# Patient Record
Sex: Female | Born: 1985 | Race: White | Hispanic: No | Marital: Married | Smoking: Never smoker
Health system: Southern US, Community
[De-identification: ages and names within clinical notes are randomized; demographics above are authoritative.]

## PROBLEM LIST (undated history)

## (undated) DIAGNOSIS — G43909 Migraine, unspecified, not intractable, without status migrainosus: Secondary | ICD-10-CM

---

## 2014-03-17 IMAGING — US US ABDOMEN COMPLETE
1 series · 14 of 25 positions shown · non-contrast
Comparison: C t enterography report, 01/10/2011

CLINICAL DATA: Abdominal pain and nausea for several months

COMPLETE ABDOMINAL ULTRASOUND

[Series 1: us abdomen complete · 0.23mm/px · 14 of 67 slices shown]
[im 1/67]
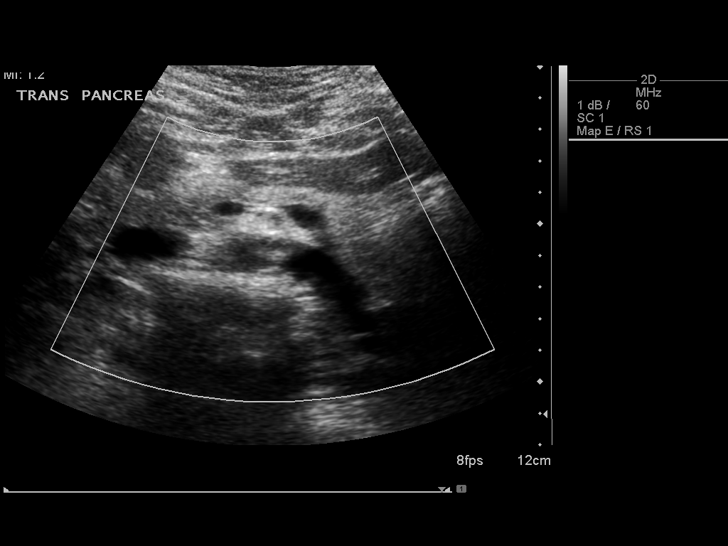
[im 6/67]
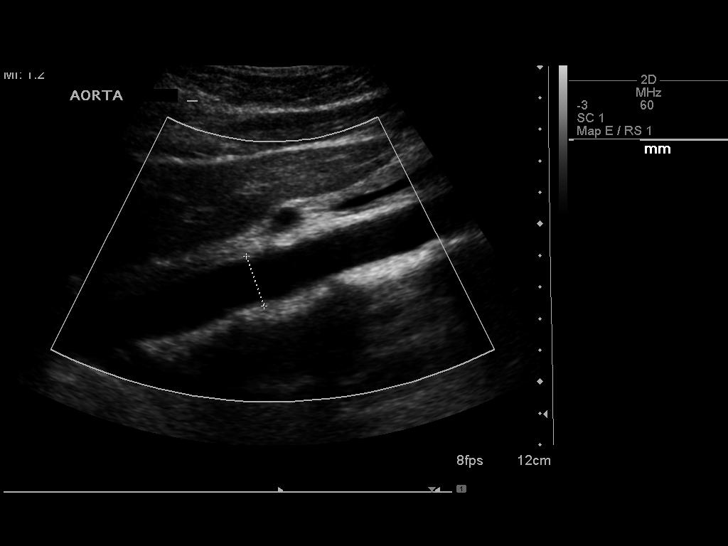
[im 12/67]
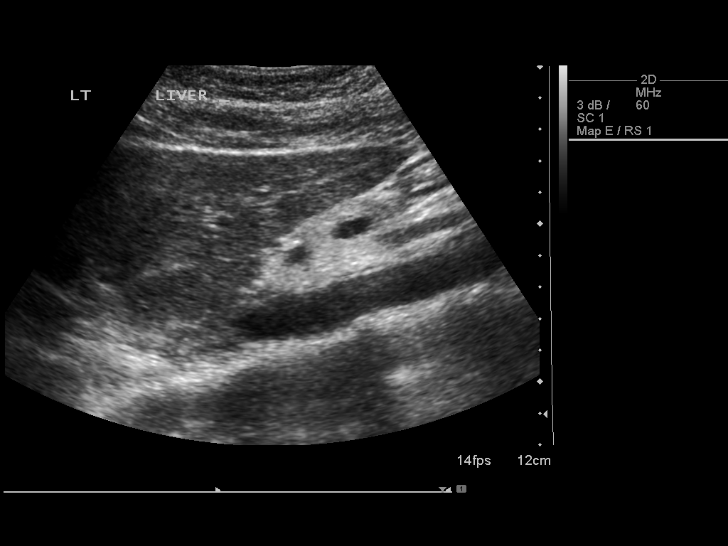
[im 17/67]
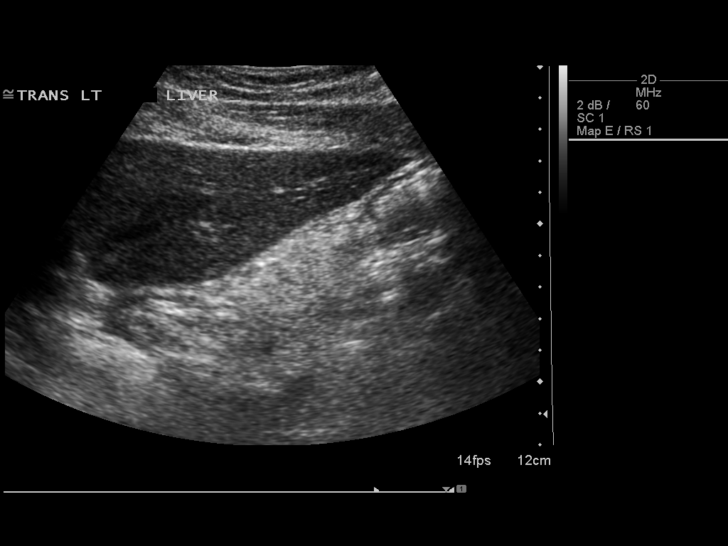
[im 23/67]
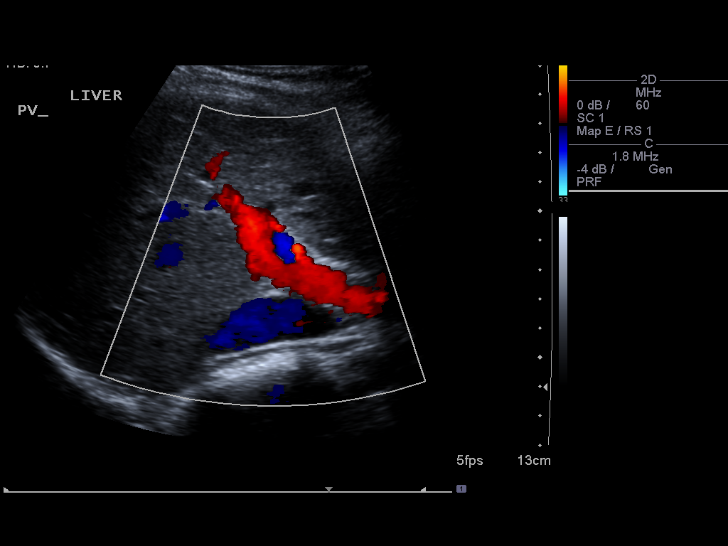
[im 25/67]
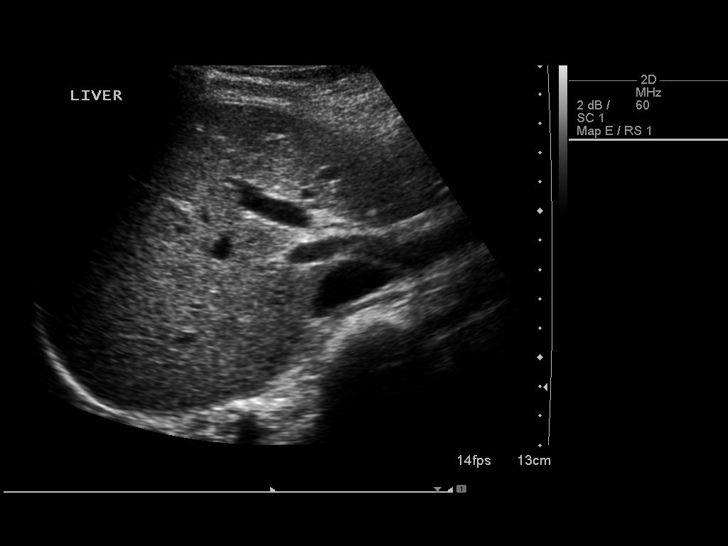
[im 31/67]
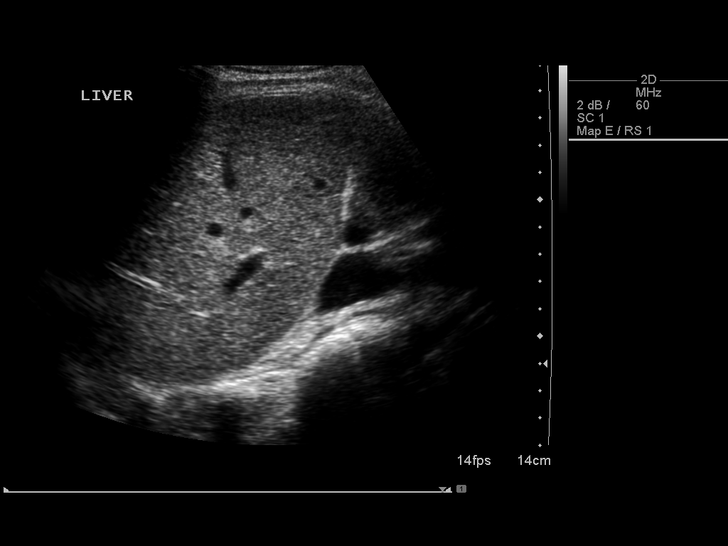
[im 36/67]
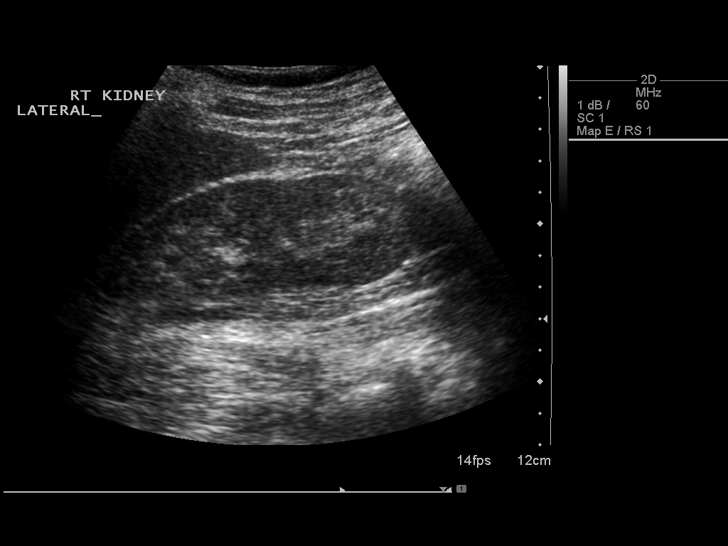
[im 42/67]
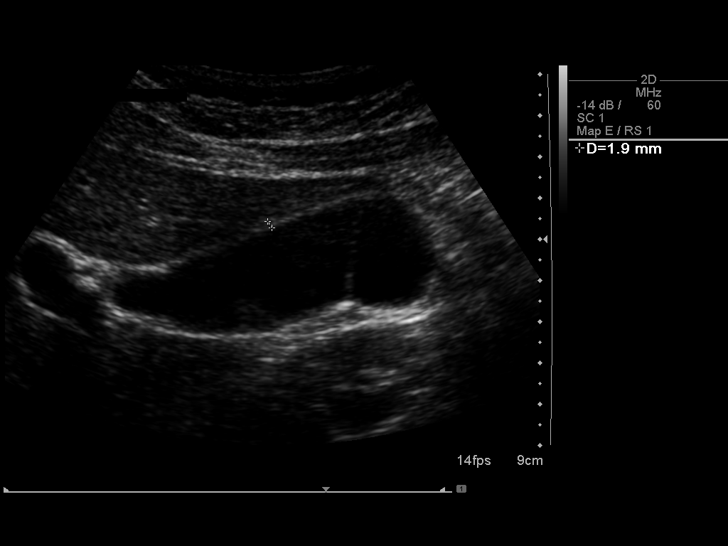
[im 45/67]
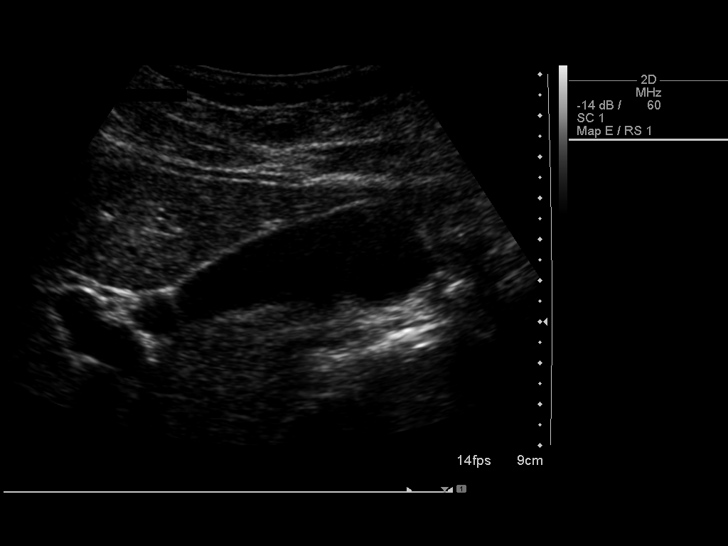
[im 50/67]
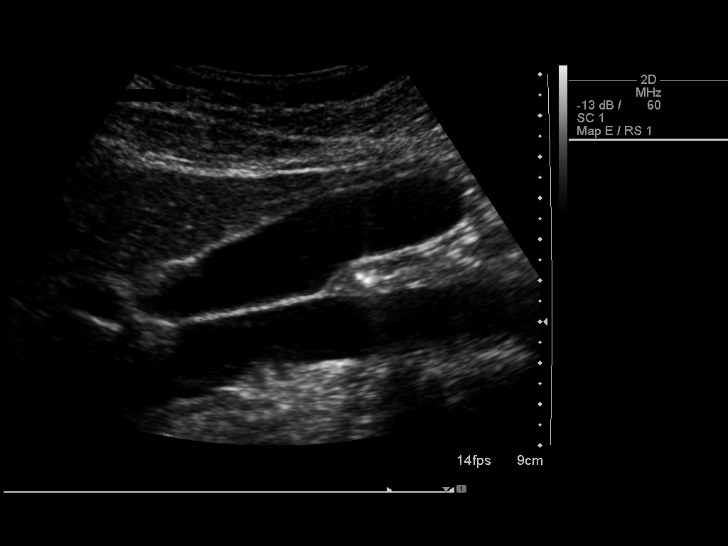
[im 56/67]
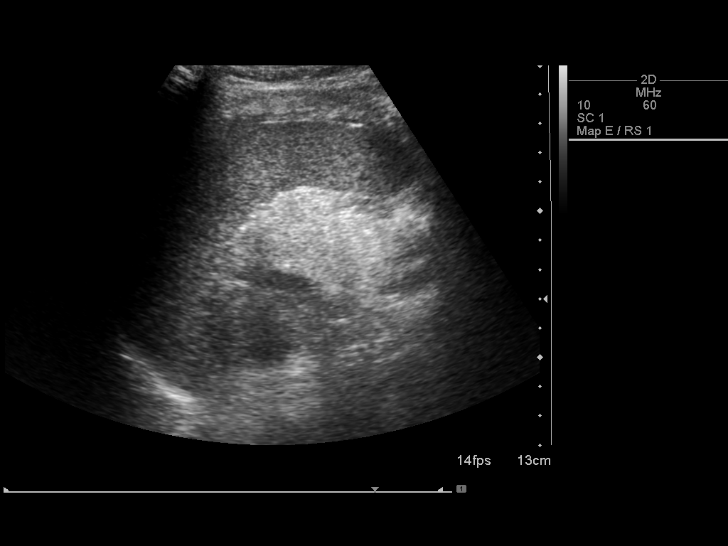
[im 61/67]
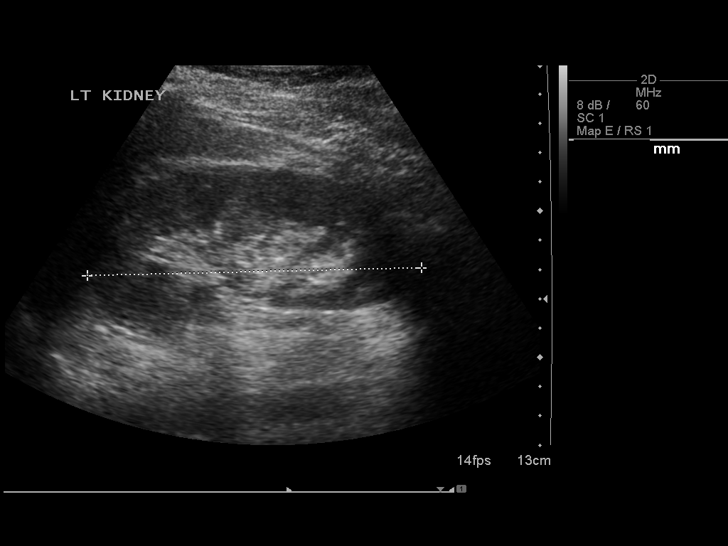
[im 67/67]
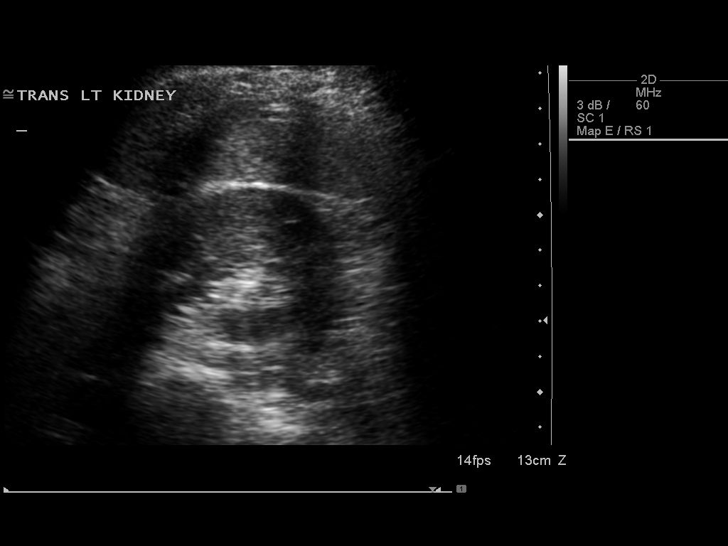

[14 of 25 positions shown; findings below may reference images not displayed]

FINDINGS: Gallbladder:  The

Common bile duct:  Normal in caliber measuring 2.7 mm

Liver:  No focal lesion identified.  Within normal limits in
parenchymal echogenicity.

IVC:  Appears normal.

Pancreas:  No focal abnormality seen.

Spleen:  Normal in size and echogenicity measuring 8.7 cm.

Right Kidney:  Normal measuring 12.3 cm.

Left Kidney:  Normal measuring 11.4 cm.

Abdominal aorta:  No aneurysm identified.
IMPRESSION: Normal abdominal ultrasound.

## 2019-10-23 ENCOUNTER — Emergency Department (HOSPITAL_BASED_OUTPATIENT_CLINIC_OR_DEPARTMENT_OTHER): Payer: BC Managed Care – PPO

## 2019-10-23 ENCOUNTER — Emergency Department (HOSPITAL_BASED_OUTPATIENT_CLINIC_OR_DEPARTMENT_OTHER)
Admission: EM | Admit: 2019-10-23 | Discharge: 2019-10-23 | Disposition: A | Discharge status: Home / Self Care | Payer: BC Managed Care – PPO | Attending: Emergency Medicine | Admitting: Emergency Medicine

## 2019-10-23 ENCOUNTER — Encounter (HOSPITAL_BASED_OUTPATIENT_CLINIC_OR_DEPARTMENT_OTHER): Payer: Self-pay

## 2019-10-23 ENCOUNTER — Other Ambulatory Visit: Payer: Self-pay

## 2019-10-23 DIAGNOSIS — R102 Pelvic and perineal pain: Secondary | ICD-10-CM | POA: Diagnosis not present

## 2019-10-23 DIAGNOSIS — R1031 Right lower quadrant pain: Secondary | ICD-10-CM | POA: Insufficient documentation

## 2019-10-23 DIAGNOSIS — R109 Unspecified abdominal pain: Secondary | ICD-10-CM | POA: Diagnosis present

## 2019-10-23 HISTORY — DX: Migraine, unspecified, not intractable, without status migrainosus: G43.909

## 2019-10-23 LAB — COMPREHENSIVE METABOLIC PANEL
ALT: 11 U/L (ref 0–44)
AST: 15 U/L (ref 15–41)
Albumin: 4.3 g/dL (ref 3.5–5.0)
Alkaline Phosphatase: 72 U/L (ref 38–126)
Anion gap: 8 (ref 5–15)
BUN: 15 mg/dL (ref 6–20)
CO2: 21 mmol/L — ABNORMAL LOW (ref 22–32)
Calcium: 9.3 mg/dL (ref 8.9–10.3)
Chloride: 108 mmol/L (ref 98–111)
Creatinine, Ser: 0.8 mg/dL (ref 0.44–1.00)
GFR calc Af Amer: >60 mL/min (ref >60)
GFR calc non Af Amer: >60 mL/min (ref >60)
Glucose, Bld: 94 mg/dL (ref 70–99)
Potassium: 3.9 mmol/L (ref 3.5–5.1)
Sodium: 137 mmol/L (ref 135–145)
Total Bilirubin: 0.5 mg/dL (ref 0.3–1.2)
Total Protein: 6.9 g/dL (ref 6.5–8.1)

## 2019-10-23 LAB — URINALYSIS, ROUTINE W REFLEX MICROSCOPIC
Bilirubin Urine: NEGATIVE
Glucose, UA: NEGATIVE mg/dL
Hgb urine dipstick: NEGATIVE
Ketones, ur: NEGATIVE mg/dL
Leukocytes,Ua: NEGATIVE
Nitrite: NEGATIVE
Protein, ur: NEGATIVE mg/dL
Specific Gravity, Urine: 1.015 (ref 1.005–1.030)
pH: 6 (ref 5.0–8.0)

## 2019-10-23 LAB — CBC
HCT: 39.9 % (ref 36.0–46.0)
Hemoglobin: 13.2 g/dL (ref 12.0–15.0)
MCH: 31.1 pg (ref 26.0–34.0)
MCHC: 33.1 g/dL (ref 30.0–36.0)
MCV: 94.1 fL (ref 80.0–100.0)
Platelets: 207 10*3/uL (ref 150–400)
RBC: 4.24 MIL/uL (ref 3.87–5.11)
RDW: 12 % (ref 11.5–15.5)
WBC: 6.9 10*3/uL (ref 4.0–10.5)
nRBC: 0 % (ref 0.0–0.2)

## 2019-10-23 LAB — WET PREP, GENITAL
Clue Cells Wet Prep HPF POC: NONE SEEN
Sperm: NONE SEEN
Trich, Wet Prep: NONE SEEN
Yeast Wet Prep HPF POC: NONE SEEN

## 2019-10-23 LAB — PREGNANCY, URINE: Preg Test, Ur: NEGATIVE

## 2019-10-23 MED ORDER — HYDROCODONE-ACETAMINOPHEN 5-325 MG PO TABS
1.0000 | ORAL_TABLET | Freq: Once | ORAL | Status: AC
  Administered 2019-10-23: 1 via ORAL
  Filled 2019-10-23: qty 1

## 2019-10-23 MED ORDER — HYDROCODONE-ACETAMINOPHEN 5-325 MG PO TABS: ORAL_TABLET | ORAL | 0 refills | Status: AC

## 2019-10-23 NOTE — ED Provider Notes (Signed)
8:22 PM signout from [employer] at shift change.   Patient with ongoing right lower quadrant abdominal pain.  Recent CT which was completely negative by PCP.  Ultrasound today is again reassuring.  Labs look good.  Patient complains pain just superior to the brim of the pelvis on the right lower quadrant of the abdomen.  This is somewhat positional in nature and worse with certain movements.  I do not have an exact etiology of the patient's pain today.  We will treat her symptoms.  Patient given #8 Vicodin for home.  Encourage PCP follow-up for further evaluation.  Patient is visibly tearful and frustrated that we cannot tell her the cause of her pain.  I attempted to reassure her and validate her frustration.  I am hopeful that her symptoms will improve and I encouraged her to follow-up with her primary care doctor to continue working on the problem.  The patient was urged to return to the Emergency Department immediately with worsening of current symptoms, worsening abdominal pain, persistent vomiting, blood noted in stools, fever, or any other concerns. The patient verbalized understanding.   BP 114/75 (BP Location: Left Arm)   Pulse (!) 58   Temp 98.8 F (37.1 C) (Oral)   Resp 16   Ht 5\' 8"  (1.727 m)   Wt 74.8 kg   SpO2 100%   BMI 25.09 kg/m   Patient counseled on use of narcotic pain medications. Counseled not to combine these medications with others containing tylenol. Urged not to drink alcohol, drive, or perform any other activities that requires focus while taking these medications. The patient verbalizes understanding and agrees with the plan.    , PA-C 10/23/19 2023    2024, MD 10/23/19 2205

## 2019-10-23 NOTE — ED Notes (Signed)
ED Provider at bedside. 

## 2019-10-23 NOTE — ED Triage Notes (Signed)
Pt c/o RLQ pain states that it initially started around her back and has moved around from. Pt did have recent CT scan to r/o kidney stone which did not show anything per patient. She has a follow up US with her GYN office next week but states that she can not wait that long r/t pain.

## 2019-10-23 NOTE — ED Provider Notes (Signed)
[facility] EMERGENCY DEPARTMENT Provider Note   CSN: [id number] Arrival date & time: 10/23/19  1516     History Chief Complaint  Patient presents with  . Abdominal Pain    Kathy Schneider is a 34 y.o. female presenting to the emergency department with 1 week of right-sided abdominal pain.  Patient states pain began last Friday and gradually worsened.  She was evaluated by her PCP, who checked her urine for UTI as well as ordered outpatient CT renal stone study.  Both were negative.  She has been scheduled for an outpatient pelvic ultrasound on Friday for further evaluation.  She states pain is persisted and worsened.  It is located in her right lower quadrant and radiates to her right low back, and aggravated by movement and activity.  She reports associated nausea without vomiting.  Denies fever, chills, urinary symptoms, vaginal discharge or bleeding.  She has not been sexually active in at least 1 year, no known history of STD.  She reports similar occurrence about 6 weeks ago which mostly resolved, however symptoms returned. LMP is unknown, patient takes OCPs and has infrequent menstrual periods.  The history is provided by the patient.       Past Medical History:  Diagnosis Date  . Migraines     There are no problems to display for this patient.   History reviewed. No pertinent surgical history.   OB History   No obstetric history on file.     No family history on file.  Social History   Tobacco Use  . Smoking status: Never Smoker  . Smokeless tobacco: Never Used  Substance Use Topics  . Alcohol use: Not Currently  . Drug use: Not Currently    Home Medications Prior to Admission medications   Not on File    Allergies    Patient has no known allergies.  Review of Systems   Review of Systems  All other systems reviewed and are negative.   Physical Exam Updated Vital Signs BP 116/78 (BP Location: Right Arm)   Pulse 66   Temp 98.8 F (37.1 C)  (Oral)   Resp 20   Ht 5\' 8"  (1.727 m)   Wt 74.8 kg   SpO2 100%   BMI 25.09 kg/m   Physical Exam Vitals and nursing note reviewed. Exam conducted with a chaperone present.  Constitutional:      General: She is not in acute distress.    Appearance: She is well-developed. She is not ill-appearing.  HENT:     Head: Normocephalic and atraumatic.  Eyes:     Conjunctiva/sclera: Conjunctivae normal.  Cardiovascular:     Rate and Rhythm: Normal rate and regular rhythm.  Pulmonary:     Effort: Pulmonary effort is normal. No respiratory distress.     Breath sounds: Normal breath sounds.  Abdominal:     General: Abdomen is flat. Bowel sounds are normal.     Palpations: Abdomen is soft.     Tenderness: There is abdominal tenderness in the right lower quadrant and periumbilical area. There is no right CVA tenderness, left CVA tenderness, guarding or rebound.     Hernia: No hernia is present.  Genitourinary:    Labia:        Right: No rash or tenderness.        Left: No rash or tenderness.      Vagina: Normal.     Cervix: No cervical motion tenderness.     Uterus: Tender. Not enlarged.  Adnexa: Right adnexa normal and left adnexa normal.       Right: No mass or tenderness.         Left: No mass or tenderness.    Skin:    General: Skin is warm.     Findings: No rash.  Neurological:     Mental Status: She is alert.  Psychiatric:        Behavior: Behavior normal.     ED Results / Procedures / Treatments   Labs (all labs ordered are listed, but only abnormal results are displayed) Labs Reviewed  WET PREP, GENITAL - Abnormal; Notable for the following components:      Result Value   WBC, Wet Prep HPF POC MANY (*)    All other components within normal limits  COMPREHENSIVE METABOLIC PANEL - Abnormal; Notable for the following components:   CO2 21 (*)    All other components within normal limits  CBC  URINALYSIS, ROUTINE W REFLEX MICROSCOPIC  PREGNANCY, URINE  GC/CHLAMYDIA  PROBE AMP ([place]) NOT AT [facility]    EKG None  Radiology No results found.  Procedures Procedures (including critical care time)  Medications Ordered in ED Medications - No data to display  ED Course  I have reviewed the triage vital signs and the nursing notes.  Pertinent labs & imaging results that were available during my care of the patient were reviewed by me and considered in my medical decision making (see chart for details).  Clinical Course as of Oct 22 1752  Sat Oct 23, 2019  1640 Patient discussed with Dr. Tamera Punt. Will perform pelvic exam and obtain U/S.   [JR]    Clinical Course User Index [JR] Corbyn Wildey, [place] N, PA-C   MDM Rules/Calculators/A&P                      Patient presenting with 1 week of right lower quadrant abdominal pain radiating to her back, worse with movement.  She has associated nausea.  No other associated symptoms.  She had outpatient CT stone study yesterday ordered by her PCP which was negative.  She had outpatient pelvic ultrasound scheduled for Friday of this week, however presents today for worsening pain.  Abdominal exam with right lower quadrant tenderness, no peritoneal signs.  No palpable hernia or masses.  Pelvic exam with tenderness to palpation of the uterus on bimanual exam, no CMT or adnexal tenderness.  Wet prep with many white cells, no clue cells, trichomoniasis or yeast.  She has low risk factors for STD as patient denies sexual activity in at least 1 year.  No leukocytosis to suggest early appendicitis missed yesterday on CT.  Urine is negative, pregnancy is negative, normal renal and hepatic function.  Pelvic ultrasound ordered for further evaluation.  Consider ovarian cyst versus possible torsion.  Care assumed at shift change by PA Geiple, to follow ultrasound result.  If negative, recommend discharge with close outpatient follow-up with PCP, symptomatic management.  Final Clinical Impression(s) / ED Diagnoses Final  diagnoses:  Pelvic pain in female    Rx / DC Orders ED Discharge Orders    None       Lenell Mcconnell, [place] N, PA-C 10/23/19 1756    Malvin Johns, MD 10/23/19 1818

## 2019-10-23 NOTE — Discharge Instructions (Signed)
Please read and follow all provided instructions.  Your diagnoses today include:  1. RLQ abdominal pain   2. Pelvic pain in female     Tests performed today include:  Blood counts and electrolytes  Blood tests to check liver and kidney function  Urine test to look for infection and pregnancy (in women)  Wet prep -no vaginal infection seen  Ultrasounds of your ovaries and pelvis -does not show any acute problems or explanations for your pain today  Vital signs. See below for your results today.   Medications prescribed:   Vicodin (hydrocodone/acetaminophen) - narcotic pain medication  DO NOT drive or perform any activities that require you to be awake and alert because this medicine can make you drowsy. BE VERY CAREFUL not to take multiple medicines containing Tylenol (also called acetaminophen). Doing so can lead to an overdose which can damage your liver and cause liver failure and possibly death.  Take any prescribed medications only as directed.  Home care instructions:   Follow any educational materials contained in this packet.  Follow-up instructions: Please follow-up with your primary care provider in the next 3 days for further evaluation of your symptoms.    Return instructions:  SEEK IMMEDIATE MEDICAL ATTENTION IF:  The pain does not go away or becomes severe   A temperature above 101F develops   Repeated vomiting occurs (multiple episodes)   The pain becomes localized to portions of the abdomen. The right side could possibly be appendicitis. In an adult, the left lower portion of the abdomen could be colitis or diverticulitis.   Blood is being passed in stools or vomit (bright red or black tarry stools)   You develop chest pain, difficulty breathing, dizziness or fainting, or become confused, poorly responsive, or inconsolable (young children)  If you have any other emergent concerns regarding your health  Additional Information: Abdominal (belly) pain  can be caused by many things. Your caregiver performed an examination and possibly ordered blood/urine tests and imaging (CT scan, x-rays, ultrasound). Many cases can be observed and treated at home after initial evaluation in the emergency department. Even though you are being discharged home, abdominal pain can be unpredictable. Therefore, you need a repeated exam if your pain does not resolve, returns, or worsens. Most patients with abdominal pain don't have to be admitted to the hospital or have surgery, but serious problems like appendicitis and gallbladder attacks can start out as nonspecific pain. Many abdominal conditions cannot be diagnosed in one visit, so follow-up evaluations are very important.  Your vital signs today were: BP 110/71 (BP Location: Left Arm)    Pulse 65    Temp 98.8 F (37.1 C) (Oral)    Resp 14    Ht 5\' 8"  (1.727 m)    Wt 74.8 kg    SpO2 100%    BMI 25.09 kg/m  If your blood pressure (bp) was elevated above 135/85 this visit, please have this repeated by your doctor within one month. --------------

## 2019-10-26 LAB — GC/CHLAMYDIA PROBE AMP (~~LOC~~) NOT AT ARMC
Chlamydia: NEGATIVE
Neisseria Gonorrhea: NEGATIVE

## 2021-08-03 IMAGING — US US PELVIS COMPLETE TRANSABD/TRANSVAG W DUPLEX
1 series · 13 of 25 positions shown · non-contrast
Comparison: CT scan 10/22/2019.

CLINICAL DATA: Right lower quadrant pain.

EXAM:
TRANSABDOMINAL AND TRANSVAGINAL ULTRASOUND OF PELVIS
DOPPLER ULTRASOUND OF OVARIES
TECHNIQUE: Color and duplex Doppler ultrasound was utilized to evaluate blood
flow to the ovaries.

[Series 1: us pelvis complete transabd/transvag w duplex · 13 of 58 slices shown]
[im 1/58]
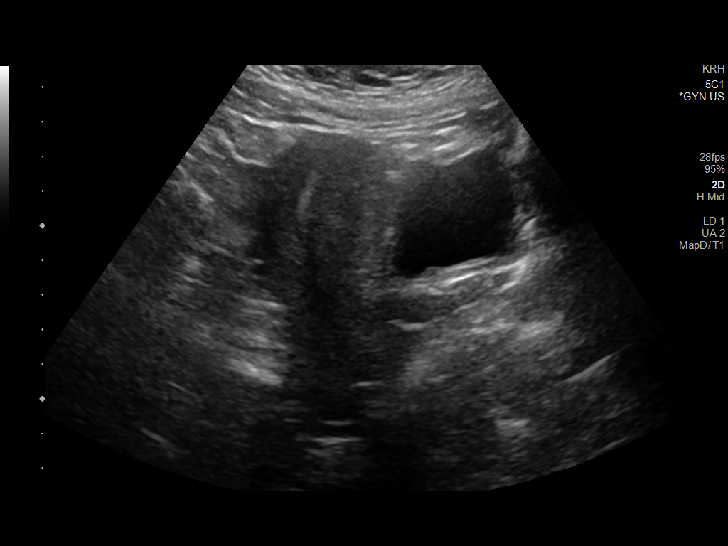
[im 5/58]
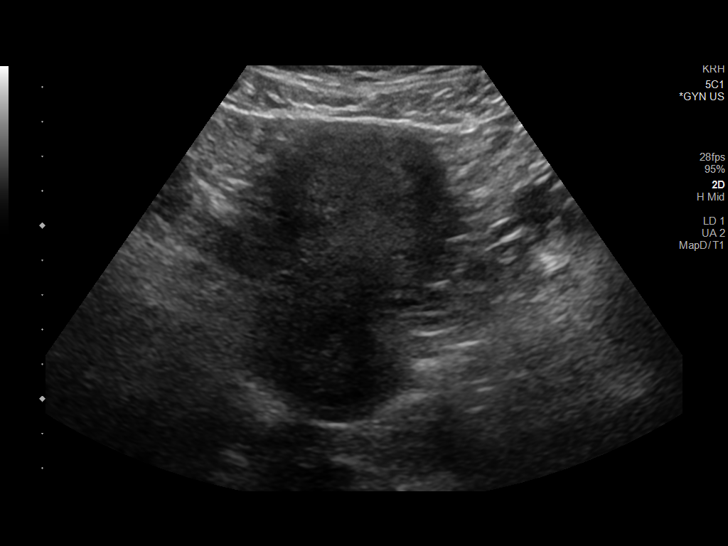
[im 10/58]
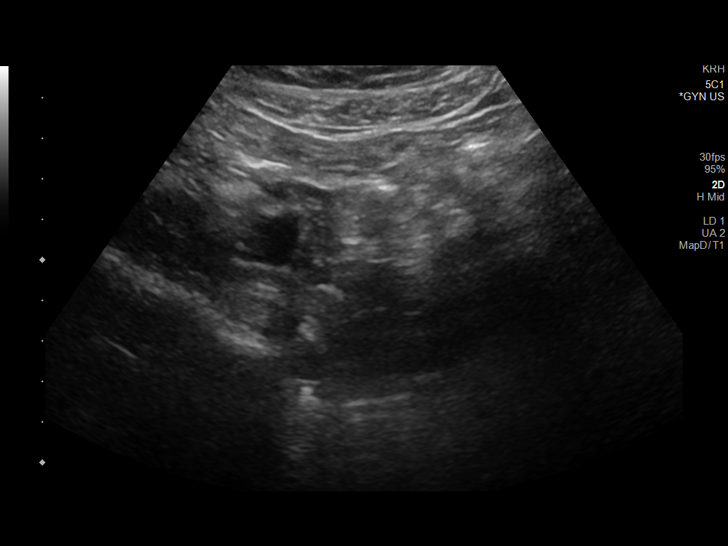
[im 15/58]
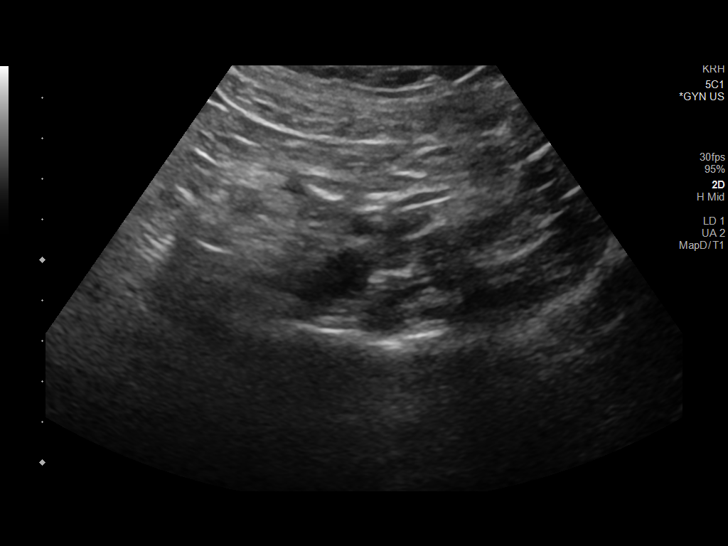
[im 20/58]
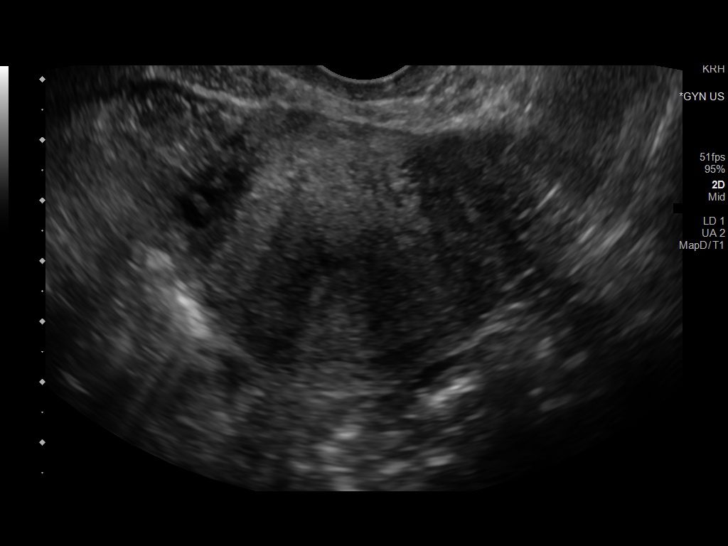
[im 24/58]
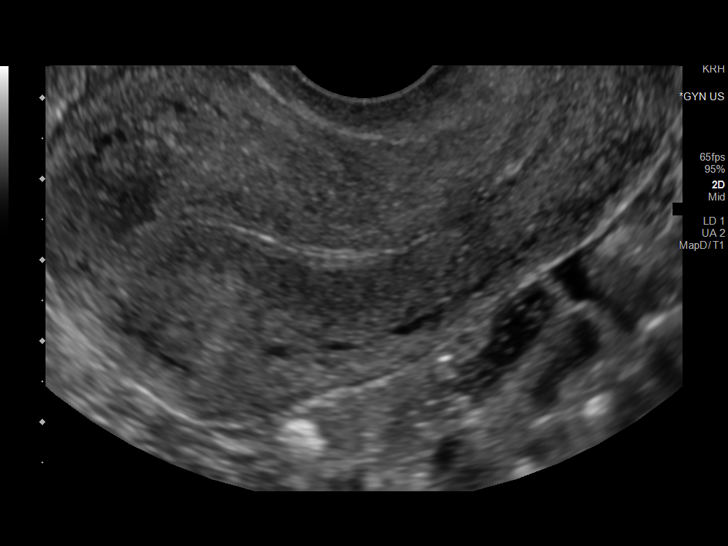
[im 29/58]
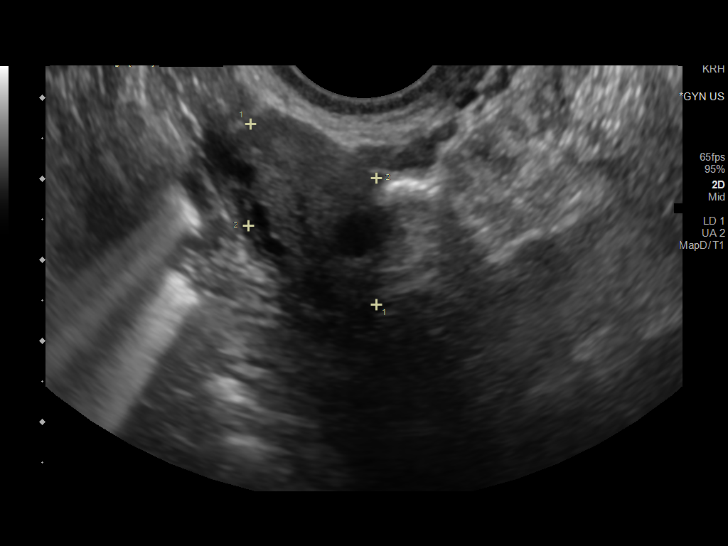
[im 34/58]
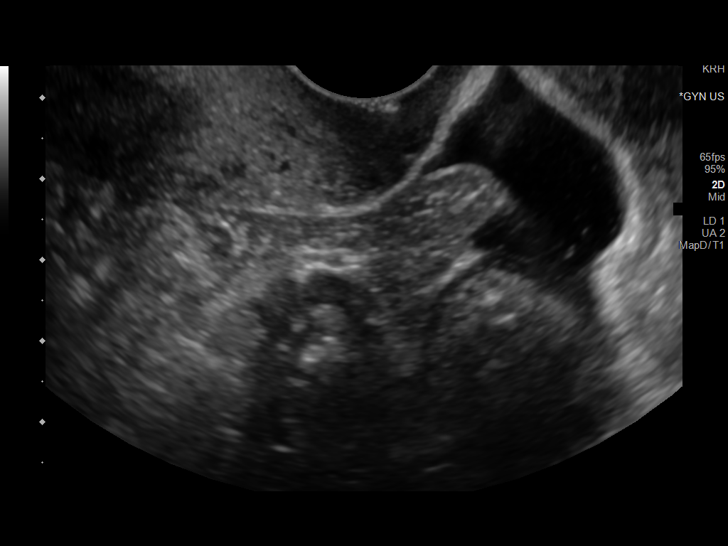
[im 39/58]
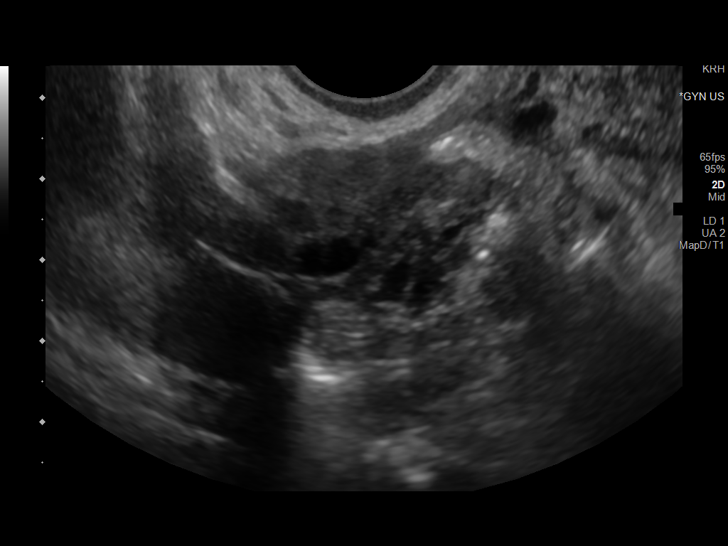
[im 43/58]
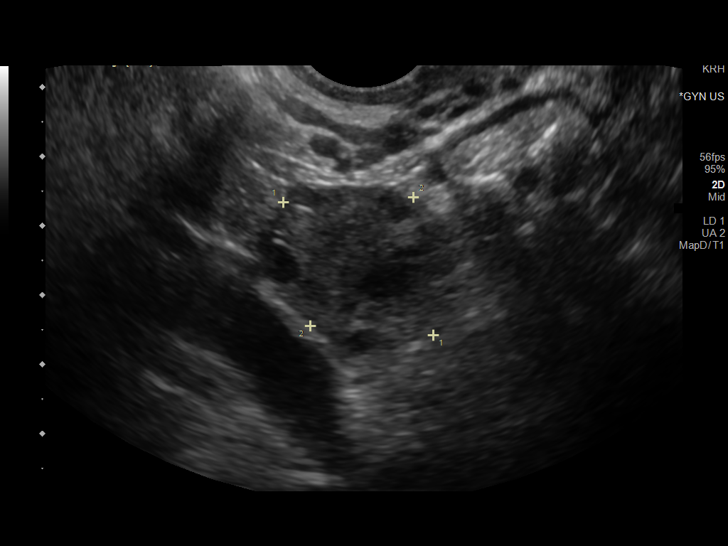
[im 48/58]
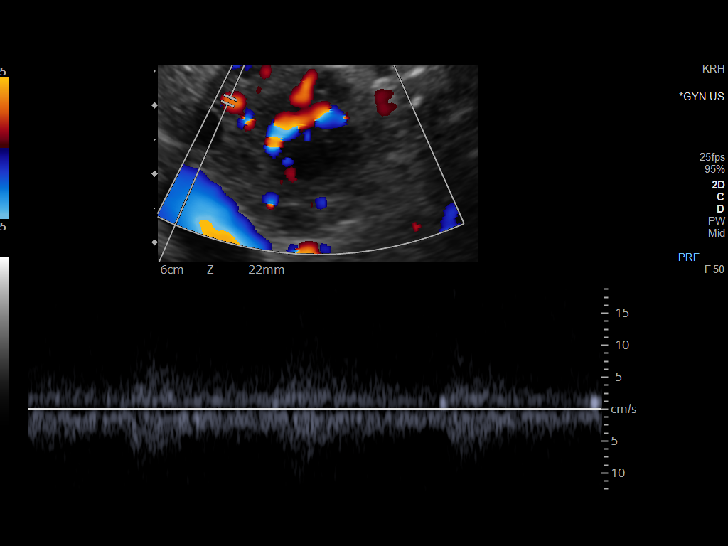
[im 53/58]
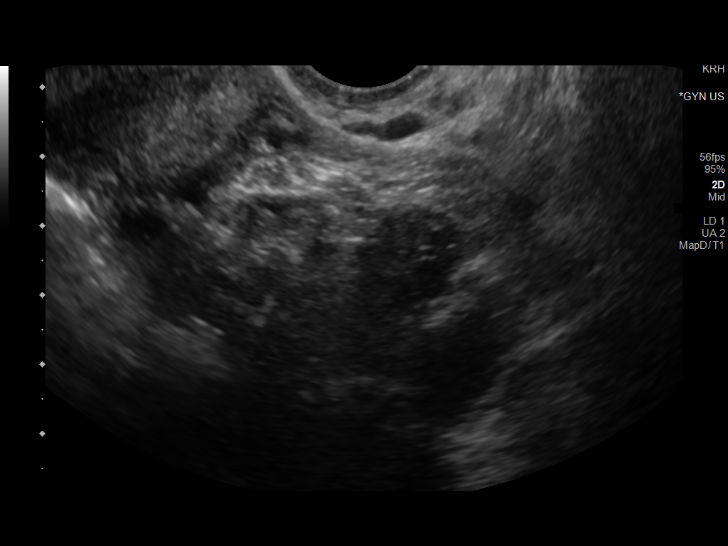
[im 58/58]
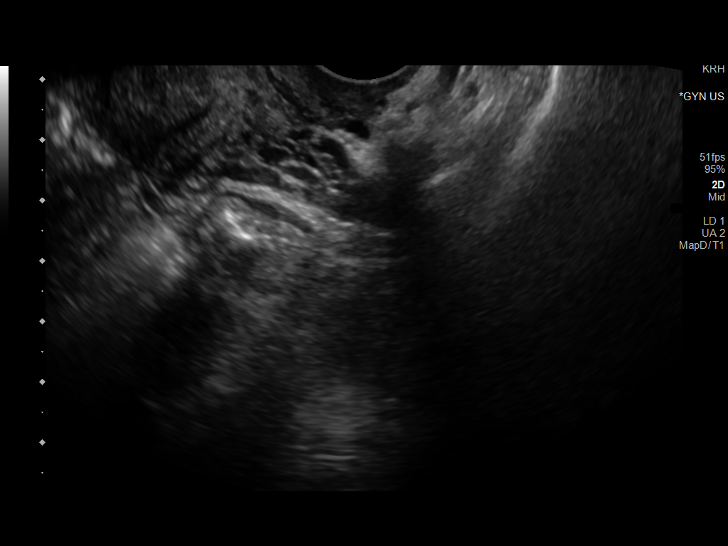

[13 of 25 positions shown; findings below may reference images not displayed]

Both transabdominal and transvaginal ultrasound examinations of the
pelvis were performed. Transabdominal technique was performed for
global imaging of the pelvis including uterus, ovaries, adnexal
regions, and pelvic cul-de-sac. It was necessary to proceed with
endovaginal exam following the transabdominal exam to visualize the
endometrium.
FINDINGS: Uterus

Measurements: 8.5 x 4.0 x 5.3 cm = volume: 95 mL. No fibroids or
other mass visualized.

Endometrium

Thickness: 4 mm.  No focal abnormality visualized.

Right ovary

Measurements: 2.7 x 1.7 x 2.4 cm = volume: 5.7 mL. Normal
appearance/no adnexal mass.

Left ovary

Measurements: 2.9 x 2.4 x 2.0 cm = volume: 7.2 mL. Normal
appearance/no adnexal mass.

Pulsed Doppler evaluation of both ovaries demonstrates normal
low-resistance arterial and venous waveforms.

Other findings

Small volume intraperitoneal free fluid.
IMPRESSION: Unremarkable study.  No findings to explain the history of pain.

## 2023-07-24 ENCOUNTER — Encounter (HOSPITAL_BASED_OUTPATIENT_CLINIC_OR_DEPARTMENT_OTHER): Payer: Self-pay

## 2023-07-24 ENCOUNTER — Other Ambulatory Visit: Payer: Self-pay

## 2023-07-24 ENCOUNTER — Emergency Department (HOSPITAL_BASED_OUTPATIENT_CLINIC_OR_DEPARTMENT_OTHER)
Admission: EM | Admit: 2023-07-24 | Discharge: 2023-07-24 | Disposition: A | Discharge status: Home / Self Care | Payer: BC Managed Care – PPO | Attending: Emergency Medicine | Admitting: Emergency Medicine

## 2023-07-24 DIAGNOSIS — L03211 Cellulitis of face: Secondary | ICD-10-CM | POA: Diagnosis not present

## 2023-07-24 DIAGNOSIS — R220 Localized swelling, mass and lump, head: Secondary | ICD-10-CM | POA: Diagnosis present

## 2023-07-24 DIAGNOSIS — L0201 Cutaneous abscess of face: Secondary | ICD-10-CM | POA: Insufficient documentation

## 2023-07-24 MED ORDER — KETOROLAC TROMETHAMINE 15 MG/ML IJ SOLN
30.0000 mg | Freq: Once | INTRAMUSCULAR | Status: AC
  Administered 2023-07-24: 30 mg via INTRAMUSCULAR
  Filled 2023-07-24: qty 2

## 2023-07-24 MED ORDER — CLINDAMYCIN HCL 300 MG PO CAPS: 300.0000 mg | ORAL_CAPSULE | Freq: Three times a day (TID) | ORAL | 0 refills | Status: AC

## 2023-07-24 MED ORDER — CELECOXIB 200 MG PO CAPS: 200.0000 mg | ORAL_CAPSULE | Freq: Two times a day (BID) | ORAL | 0 refills | Status: AC

## 2023-07-24 NOTE — ED Provider Notes (Signed)
[place] EMERGENCY DEPARTMENT AT [facility] Provider Note   CSN: [id number] Arrival date & time: 07/24/23  0750     History Chief Complaint  Patient presents with   Abscess    HPI Kathy Schneider is a 37 y.o. female presenting for facial swelling. 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  States that she thinks she bit her lip approximately 4 days ago and has had circumscribed redness and swelling all across to her lip.  It does not involve her intraoral tissue. Extensive history of immunodeficiency requiring infusions.  She unfortunately lost her insurance coverage and as such has lost her access to the infusions.  Prior to the infusions earlier this year, she had very frequent episodes of cellulitis, hidradenitis, streptococcal infections and was on antibiotics frequently throughout the past 5 years. She has been out of the infusions over the last 3 months and has had multiple recurrent presentations similar to this today. She denies systemic fevers chills nausea vomiting syncope or shortness of breath.  Pain is all localized around the erythema..   Patient's recorded medical, surgical, social, medication list and allergies were reviewed in the Snapshot window as part of the initial history.   Review of Systems   Review of Systems  Constitutional:  Negative for chills and fever.  HENT:  Negative for ear pain and sore throat.   Eyes:  Negative for pain and visual disturbance.  Respiratory:  Negative for cough and shortness of breath.   Cardiovascular:  Negative for chest pain and palpitations.  Gastrointestinal:  Negative for abdominal pain and vomiting.  Genitourinary:  Negative for dysuria and hematuria.  Musculoskeletal:  Negative for arthralgias and back pain.  Skin:  Positive for rash and wound. Negative for color change.  Neurological:  Negative for  seizures and syncope.  All other systems reviewed and are negative.   Physical Exam Updated Vital Signs BP 114/83 (BP Location: Right Arm)   Pulse 85   Temp 98.1 F (36.7 C) (Oral)   Resp 18   Ht 5\' 9"  (1.753 m)   Wt 79.4 kg   SpO2 96%   BMI 25.84 kg/m  Physical Exam Constitutional:      General: She is not in acute distress.    Appearance: She is not ill-appearing or toxic-appearing.  HENT:     Head: Normocephalic and atraumatic.  Eyes:     Extraocular Movements: Extraocular movements intact.     Pupils: Pupils are equal, round, and reactive to light.  Cardiovascular:     Rate and Rhythm: Normal rate.  Pulmonary:     Effort: No respiratory distress.  Abdominal:     General: Abdomen is flat.  Musculoskeletal:        General: No swelling, deformity or signs of injury.     Cervical back: Normal range of motion. No rigidity.  Skin:    General: Skin is warm and dry.     Findings: Lesion (1 cm of circumscribed erythema all across the border of the lower lip margin.  Lower lip is plethoric and tender to palpation.) present.  Neurological:     General: No focal deficit present.     Mental Status: She is alert and oriented to person, place, and time.  Psychiatric:        Mood and Affect: Mood normal.      ED Course/ Medical Decision Making/ A&P  Clinical Course as of 07/24/23 0819  Thu Jul 24, 2023  1610 [facility]  [CC]    Clinical Course User Index [CC] Glyn Ade, MD    Procedures Procedures   Medications Ordered in ED Medications  ketorolac (TORADOL) 15 MG/ML injection 30 mg (has no administration in time range)    Medical Decision Making:    [name] is a 37 y.o. female who presented to the ED today with lip swelling and redness detailed above.     Complete initial physical exam performed, notably the patient  was hemodynamically stable no acute distress.      Reviewed and confirmed nursing documentation for past medical history, family history,  social history.    Initial Assessment:   With the patient's presentation of lip swelling and redness, most likely diagnosis is cellulitis secondary to her immunodeficiency and loss of access to immunotherapy. Other diagnoses were considered including (but not limited to) abscess, Ludwig's angina, RPA, PTA, streptococcal pharyngitis. These are considered less likely due to history of present illness and physical exam findings.   This is most consistent with an acute complicated illness  Initial Plan:  Considered possibility of incision and drainage and discussed with the patient.  There appears to be a very small amount of fluctuance, unlikely to cause significant symptomatic improvement and is localized in a very cosmetically involved space along her lip margin. Shared medical decision making patient felt comfortable with antibiotics and anti-inflammatories with plan to follow-up with her ENT and immunologist as well as her PCP for reestablishing her immunologic therapies. Per chart review, she has a history of response to clindamycin when she has infections like this will treat clindamycin x 5 days and recommend follow-up with PCP as above. Disposition:  I have considered need for hospitalization, however, considering all of the above, I believe this patient is stable for discharge at this time.  Patient/family educated about specific return precautions for given chief complaint and symptoms.  Patient/family educated about follow-up with PCP and ENT/Immuno.     Patient/family expressed understanding of return precautions and need for follow-up. Patient spoken to regarding all imaging and laboratory results and appropriate follow up for these results. All education provided in verbal form with additional information in written form. Time was allowed for answering of patient questions. Patient discharged.    Emergency Department Medication Summary:   Medications  ketorolac (TORADOL) 15 MG/ML  injection 30 mg (has no administration in time range)         Clinical Impression:  1. Cellulitis of face      Data Unavailable   Final Clinical Impression(s) / ED Diagnoses Final diagnoses:  Cellulitis of face    Rx / DC Orders ED Discharge Orders          Ordered    clindamycin (CLEOCIN) 300 MG capsule  3 times daily        07/24/23 0813    celecoxib (CELEBREX) 200 MG capsule  2 times daily        07/24/23 0813              Glyn Ade, MD 07/24/23 [phone]

## 2023-07-24 NOTE — Discharge Instructions (Addendum)
You were seen today for facial swelling. We did not identify any emergent cause for your symptoms. Your evaluation is most consistent with cellulitis of the lip.   Plan and next steps:   The following may be helpful in managing your symptoms:   Pain- Lidocaine Patches  Apply to affected area for up to 12 hours at a time.   Pain/Fever- Adult Tylenol dosing:  650 mg orally every 4 to 6 hours as needed, MAX: 3250 mg/24 hours   (Extra-strength) 1000 mg orally every 6 hours as needed; MAX: 3000 mg/24 hours   Do not use if you have liver disease. Read the label on the bottle.   Pain: Celecoxib (Extra-strength) 200 mg orally every 8 hours as needed; MAX: 400 mg/24 hours   Do not use if you have kidney disease/take blood thinners/ have a history of GI bleeding. Read the label on the bottle.    Findings:  You may see all of your lab and imaging results utilizing our online portal! Look in this document or ask a team member for your mychart* access information. The most notable results have additionally been verbally communicated with you and your bedside family.    Follow-up Plan:   Follow up with the patient's normal primary care provider for monitoring of this condition within 48 hours.   Signs/Symptoms that would warrant return to the ED:  Please return to the ED if you experience worsening of symptoms or any abrupt changes in your health. Standard of care precautions for your chief complaint have already been verbally communicated with you. Always be on alert for fevers, chills, shortness of breath, chest pains, or sudden changes that warrant immediate evaluation.    Thank you for allowing [place] to be a part of you and your families' care.   Glyn Ade MD

## 2023-07-24 NOTE — ED Triage Notes (Signed)
Pt presents pov with complaints of  swelling and redness to lower lip X 2days. Pt reported that it started as a "bump" under her lip. Denies SOB. +nausea.

## 2023-07-24 NOTE — ED Notes (Signed)
Pt alert and oriented X 4 at the time of discharge. RR even and unlabored. No acute distress noted. Pt verbalized understanding of discharge instructions as discussed. Pt ambulatory to lobby at time of discharge.
